# Patient Record
Sex: Female | Born: 1995 | Race: White | Hispanic: No | Marital: Single | Smoking: Never smoker
Health system: Southern US, Community
[De-identification: ages and names within clinical notes are randomized; demographics above are authoritative.]

## PROBLEM LIST (undated history)

## (undated) DIAGNOSIS — L309 Dermatitis, unspecified: Secondary | ICD-10-CM

## (undated) DIAGNOSIS — F419 Anxiety disorder, unspecified: Secondary | ICD-10-CM

## (undated) DIAGNOSIS — F429 Obsessive-compulsive disorder, unspecified: Secondary | ICD-10-CM

## (undated) DIAGNOSIS — B019 Varicella without complication: Secondary | ICD-10-CM

## (undated) DIAGNOSIS — N390 Urinary tract infection, site not specified: Secondary | ICD-10-CM

## (undated) HISTORY — DX: Urinary tract infection, site not specified: N39.0

## (undated) HISTORY — DX: Anxiety disorder, unspecified: F41.9

## (undated) HISTORY — DX: Dermatitis, unspecified: L30.9

## (undated) HISTORY — DX: Varicella without complication: B01.9

## (undated) HISTORY — DX: Obsessive-compulsive disorder, unspecified: F42.9

---
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
## 2015-02-13 ENCOUNTER — Ambulatory Visit (INDEPENDENT_AMBULATORY_CARE_PROVIDER_SITE_OTHER): Payer: PPO | Admitting: Family Medicine

## 2015-02-13 VITALS — BP 108/70 | HR 123 | Temp 102.2°F | Resp 18 | Ht 62.25 in | Wt 142.4 lb

## 2015-02-13 DIAGNOSIS — R05 Cough: Secondary | ICD-10-CM | POA: Diagnosis not present

## 2015-02-13 DIAGNOSIS — R509 Fever, unspecified: Secondary | ICD-10-CM | POA: Diagnosis not present

## 2015-02-13 DIAGNOSIS — J029 Acute pharyngitis, unspecified: Secondary | ICD-10-CM | POA: Diagnosis not present

## 2015-02-13 DIAGNOSIS — R059 Cough, unspecified: Secondary | ICD-10-CM

## 2015-02-13 MED ORDER — HYDROCODONE-HOMATROPINE 5-1.5 MG/5ML PO SYRP: 5.0000 mL | ORAL_SOLUTION | Freq: Three times a day (TID) | ORAL | Status: DC | PRN

## 2015-02-13 MED ORDER — IBUPROFEN 200 MG PO TABS
800.0000 mg | ORAL_TABLET | Freq: Once | ORAL | Status: AC
  Administered 2015-02-13: 800 mg via ORAL

## 2015-02-13 MED ORDER — AMOXICILLIN 875 MG PO TABS: 875.0000 mg | ORAL_TABLET | Freq: Two times a day (BID) | ORAL | Status: DC

## 2015-02-13 NOTE — Progress Notes (Signed)
By signing my name below, I, Stann Ore, attest that this documentation has been prepared under the direction and in the presence of Elvina Sidle, MD. Electronically Signed: Stann Ore, Scribe. 02/13/2015 , 1:45 PM .  Patient was seen in room 9 .   Patient ID: Mariah Cross MRN: [id number], DOB: 12/18/1995, 20 y.o. Date of Encounter: 02/13/2015  Primary Physician: Georgiann Hahn, MD  Chief Complaint:  Chief Complaint  Patient presents with  . URI    started on friday with a sore throat and headache. Body aches. Chills,     HPI:  Mariah Cross is a 20 y.o. female who presents to [facility] complaining of URI symptoms that started 2 days ago. She states that it started with a cough with scratchy throat 2 days ago. Then, yesterday, she noticed a migraine with sore throat, myalgia and chills. It has gradually worsened since. She notes having intermittent productive coughs. She's been taking motrin without relief.   She attends [employer]. 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   Past Medical History  Diagnosis Date  . OCD (obsessive compulsive disorder)   . Anxiety 05/24/2011  . Eczema   . Varicella 1999  . UTI (lower urinary tract infection) 05/26/2011    cystitis,E.Coli, Rx TMP SMX, first UTI     Home Meds: Prior to Admission medications   Medication Sig Start Date End Date Taking? Authorizing Provider  clonazePAM (KLONOPIN) 0.5 MG tablet One at onset of panic 06/27/14  Yes Tonye Pearson, MD  escitalopram (LEXAPRO) 20 MG tablet Take 1 tablet (20 mg total) by mouth daily. 11/09/14  Yes Todd McVeigh, PA  triamcinolone ointment (KENALOG) 0.1 % Apply 1 application topically 3 (three) times daily. 03/14/14  Yes Collie Siad English, PA    Allergies: No Known Allergies  Social History   Social History  . Marital Status: Single    Spouse Name: N/A  . Number of Children: N/A  . Years of Education: N/A    Occupational History  . Not on file.   Social History Main Topics  . Smoking status: Never Smoker   . Smokeless tobacco: Never Used  . Alcohol Use: No  . Drug Use: No  . Sexual Activity: No   Other Topics Concern  . 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   Lives with mom and 2 brothers   Parents divorced   Strong family hx of alcohol and drug addiction and anxiety/panic/OCD disorder                        Review of Systems: Constitutional: negative for fever, night sweats, weight changes, or fatigue; positive for chills HEENT: negative for vision changes, hearing loss, congestion, rhinorrhea, ST, epistaxis, or sinus pressure; positive for sore throat Cardiovascular: negative for chest pain or palpitations Respiratory: negative for hemoptysis, wheezing, shortness of breath; positive for cough Abdominal: negative for abdominal pain, nausea, vomiting, diarrhea, or constipation Dermatological: negative for rash Musc: positive for myalgia (general) Neurologic: negative for dizziness, or syncope; positive for headache All other systems reviewed and are otherwise negative with the exception to those above and in the HPI.  Physical Exam: Blood pressure 108/70, pulse 123, temperature 102.2 F (39 C), temperature source Oral, resp. rate 18, height 5' 2.25" (1.581 m), weight 142 lb 6.4 oz (64.592 kg), last menstrual period 12/14/2014, SpO2  97 %., Body mass index is 25.84 kg/(m^2). General: Well developed, well nourished, in no acute distress. Head: Normocephalic, atraumatic, eyes without discharge, sclera non-icteric, nares are without discharge. Bilateral auditory canals clear, TM's are without perforation, pearly grey and translucent with reflective cone of light bilaterally. Oral cavity moist, posterior pharynx without exudate, erythema, peritonsillar abscess, or post nasal drip.  Neck: Supple. No thyromegaly. Full ROM. No  lymphadenopathy. Lungs: Clear bilaterally to auscultation without wheezes, rales, or rhonchi. Breathing is unlabored. Heart: RRR with S1 S2. No murmurs, rubs, or gallops appreciated. Msk:  Strength and tone normal for age. Extremities/Skin: Warm and dry. No clubbing or cyanosis. No edema. No rashes or suspicious lesions. Neuro: Alert and oriented X 3. Moves all extremities spontaneously. Gait is normal. CNII-XII grossly in tact. Psych:  Responds to questions appropriately with a normal affect.   Labs:  ASSESSMENT AND PLAN:  20 y.o. year old female with  This chart was scribed in my presence and reviewed by me personally.    ICD-9-CM ICD-10-CM   1. Fever, unspecified 780.60 R50.9 ibuprofen (ADVIL,MOTRIN) tablet 800 mg     amoxicillin (AMOXIL) 875 MG tablet     HYDROcodone-homatropine (HYCODAN) 5-1.5 MG/5ML syrup     Culture, Group A Strep  2. Acute pharyngitis, unspecified etiology 462 J02.9 amoxicillin (AMOXIL) 875 MG tablet     Culture, Group A Strep  3. Cough 786.2 R05 HYDROcodone-homatropine (HYCODAN) 5-1.5 MG/5ML syrup       Signed, Elvina Sidle, MD 02/13/2015 1:45 PM

## 2015-02-13 NOTE — Patient Instructions (Signed)
Usually the fluid begins with a high fever on day 1. Your symptoms are more typical for pharyngitis with cough. The throat culture should be back by Tuesday or Wednesday.

## 2015-02-15 LAB — CULTURE, GROUP A STREP: Organism ID, Bacteria: NORMAL

## 2015-06-03 NOTE — Telephone Encounter (Signed)
close

## 2016-01-31 ENCOUNTER — Ambulatory Visit (INDEPENDENT_AMBULATORY_CARE_PROVIDER_SITE_OTHER): Payer: PPO | Admitting: Physician Assistant

## 2016-01-31 VITALS — BP 103/69 | HR 92 | Temp 98.0°F | Resp 16 | Ht 62.25 in | Wt 133.6 lb

## 2016-01-31 DIAGNOSIS — D72829 Elevated white blood cell count, unspecified: Secondary | ICD-10-CM

## 2016-01-31 DIAGNOSIS — D72828 Other elevated white blood cell count: Secondary | ICD-10-CM | POA: Diagnosis not present

## 2016-01-31 DIAGNOSIS — J029 Acute pharyngitis, unspecified: Secondary | ICD-10-CM | POA: Diagnosis not present

## 2016-01-31 DIAGNOSIS — J351 Hypertrophy of tonsils: Secondary | ICD-10-CM

## 2016-01-31 LAB — POCT CBC
Granulocyte percent: 77.2 %G (ref 37–80)
HCT, POC: 40.7 % (ref 37.7–47.9)
Hemoglobin: 14 g/dL (ref 12.2–16.2)
Lymph, poc: 1.6 (ref 0.6–3.4)
MCH, POC: 28.5 pg (ref 27–31.2)
MCHC: 34.3 g/dL (ref 31.8–35.4)
MCV: 83.1 fL (ref 80–97)
MID (cbc): 0.9 (ref 0–0.9)
MPV: 7.6 fL (ref 0–99.8)
POC Granulocyte: 8.3 — AB (ref 2–6.9)
POC LYMPH PERCENT: 14.9 %L (ref 10–50)
POC MID %: 7.9 % (ref 0–12)
Platelet Count, POC: 231 10*3/uL (ref 142–424)
RBC: 4.9 M/uL (ref 4.04–5.48)
RDW, POC: 13.6 %
WBC: 10.8 10*3/uL — AB (ref 4.6–10.2)

## 2016-01-31 MED ORDER — METHYLPREDNISOLONE ACETATE 80 MG/ML IJ SUSP
80.0000 mg | Freq: Once | INTRAMUSCULAR | Status: AC
  Administered 2016-01-31: 80 mg via INTRAMUSCULAR

## 2016-01-31 MED ORDER — CLINDAMYCIN HCL 300 MG PO CAPS: 300.0000 mg | ORAL_CAPSULE | Freq: Three times a day (TID) | ORAL | 0 refills | Status: AC

## 2016-01-31 NOTE — Progress Notes (Signed)
Mariah Cross  MRN: [id number] DOB: 1995/12/22  PCP: Georgiann Hahn, MD  Subjective:  Pt is a 21 year old female who presents to clinic for sore throat x several months. She started feeling bad in Sept.  Since that time, she has presented to [facility] multiple times, she has been tested multiple times for Strep, mono, flu  - all negative. She has been treated with prednisone, Omnicef, and supportive care. She has never felt 100% better.  Tonsils never went down, but pain went away. Throat is still swollen.  Today she c/o swollen tonsils. +body aches, chills, some fatigue, nauseous Admits to sexual contact in the past 6 months.    09/28/2015 - Rapid strep, strep culture, mono - negative -  Tx w Cefdinir  9/11 - Influenza, mono - negative - no treatment 9/26 - Yearly STD testing - CG/Chlamydia - negative - no treatment 10/26 - CG/Chlamydia, Rapid strep - negative.  - no treatment 11/09 - Oral culture, mono, rapid strep - negative - Tx w Deltasone taper and Cefdinir 11/14 - RPR, HIV - non reactive - no treatment  Review of Systems  Constitutional: Positive for chills and fatigue. Negative for diaphoresis and fever.  HENT: Positive for sore throat, trouble swallowing and voice change. Negative for congestion, postnasal drip, rhinorrhea, sinus pressure and sneezing.   Respiratory: Negative for cough, chest tightness, shortness of breath and wheezing.   Cardiovascular: Negative for chest pain and palpitations.  Gastrointestinal: Positive for nausea. Negative for abdominal pain, diarrhea and vomiting.  Neurological: Negative for weakness, light-headedness and headaches.  Psychiatric/Behavioral: Positive for sleep disturbance. Negative for agitation.    Patient Active Problem List   Diagnosis Date Noted  . Scoliosis?, mild 07/15/2011  . Anxiety 05/24/2011  . OCD (obsessive compulsive disorder) 07/16/2010    Current Outpatient Prescriptions on File Prior to Visit   Medication Sig Dispense Refill  . clonazePAM (KLONOPIN) 0.5 MG tablet One at onset of panic 30 tablet 1  . escitalopram (LEXAPRO) 20 MG tablet Take 1 tablet (20 mg total) by mouth daily. 90 tablet 0   No current facility-administered medications on file prior to visit.     No Known Allergies   Objective:  BP 103/69 (BP Location: Right Arm, Patient Position: Sitting, Cuff Size: Small)   Pulse 92   Temp 98 F (36.7 C) (Oral)   Resp 16   Ht 5' 2.25" (1.581 m)   Wt 133 lb 9.6 oz (60.6 kg)   LMP 12/30/2015   SpO2 96%   BMI 24.24 kg/m   Physical Exam  Constitutional: She is oriented to person, place, and time and well-developed, well-nourished, and in no distress. No distress.  HENT:  Right Ear: Tympanic membrane normal.  Left Ear: Tympanic membrane normal.  Mouth/Throat: Mucous membranes are normal. Posterior oropharyngeal edema present. No oropharyngeal exudate, posterior oropharyngeal erythema or tonsillar abscesses.    Cardiovascular: Normal rate, regular rhythm and normal heart sounds.   Pulmonary/Chest: Effort normal and breath sounds normal. No respiratory distress.  Lymphadenopathy:       Head (right side): Posterior auricular adenopathy present.       Head (left side): Posterior auricular adenopathy present.  Neurological: She is alert and oriented to person, place, and time. GCS score is 15.  Skin: Skin is warm and dry.  Psychiatric: Mood, memory, affect and judgment normal.  Vitals reviewed.   Results for orders placed or performed in visit on 01/31/16  POCT CBC  Result  Value Ref Range   WBC 10.8 (A) 4.6 - 10.2 K/uL   Lymph, poc 1.6 0.6 - 3.4   POC LYMPH PERCENT 14.9 10 - 50 %L   MID (cbc) 0.9 0 - 0.9   POC MID % 7.9 0 - 12 %M   POC Granulocyte 8.3 (A) 2 - 6.9   Granulocyte percent 77.2 37 - 80 %G   RBC 4.90 4.04 - 5.48 M/uL   Hemoglobin 14.0 12.2 - 16.2 g/dL   HCT, POC 16.140.7 09.637.7 - 47.9 %   MCV 83.1 80 - 97 fL   MCH, POC 28.5 27 - 31.2 pg   MCHC 34.3  31.8 - 35.4 g/dL   RDW, POC 04.513.6 %   Platelet Count, POC 231 142 - 424 K/uL   MPV 7.6 0 - 99.8 fL    Assessment and Plan :  1. Sore throat 2. Swollen tonsil 3. Acute pharyngitis, unspecified etiology 4. Granulocytosis 5. Leukocytosis, unspecified type - POCT CBC - GC NAA, Pharyngeal - Ambulatory referral to ENT - methylPREDNISolone acetate (DEPO-MEDROL) injection 80 mg; Inject 1 mL (80 mg total) into the muscle once. - clindamycin (CLEOCIN) 300 MG capsule; Take 1 capsule (300 mg total) by mouth 3 (three) times daily.  Dispense: 30 capsule; Refill: 0 - CG NAA labs pending. Will contact with results. Referral to ENT as she has presented [facility] her [facility] multiple times in the past few months with no relief. Supportive care: Salt water gargles, warm tea with honey. RTC if no improvement/worsening symptoms.   Mariah CollieWhitney Louisiana Searles, PA-C  [facility] 01/31/2016 3:08 PM

## 2016-01-31 NOTE — Patient Instructions (Addendum)
Please take the entire course of your antibiotics. If you start feeling better and stop taking it, the infection could come back - and come back resistant to the medication you were taking. This is important for all your antibiotic regimens.  Please stay well hydrated, drink at least 2 liters of water a day.  For sore throat - Use warm salt water gargles. Gargle liquid benadryl. Warm tea with honey  ENT will call you to schedule an appt.   Thank you for coming in today. I hope you feel we met your needs.  Feel free to call [facility] if you have any questions or further requests.  Please consider signing up for MyChart if you do not already have it, as this is a great way to communicate with me.  Best,  Whitney McVey, PA-C  IF you received an x-ray today, you will receive an invoice from [facility]. Please contact [facility] at [phone] with questions or concerns regarding your invoice.   IF you received labwork today, you will receive an invoice from [facility]. Please contact [facility] at [phone] with questions or concerns regarding your invoice.   Our billing staff will not be able to assist you with questions regarding bills from these companies.  You will be contacted with the lab results as soon as they are available. The fastest way to get your results is to activate your My Chart account. Instructions are located on the last page of this paperwork. If you have not heard from [place] regarding the results in 2 weeks, please contact this office.

## 2016-02-01 LAB — GC NAA, PHARYNGEAL

## 2016-02-08 LAB — GC CULTURE ONLY

## 2016-02-08 LAB — SPECIMEN STATUS REPORT

## 2016-08-20 ENCOUNTER — Telehealth: Payer: Self-pay | Admitting: Physician Assistant

## 2016-08-20 NOTE — Telephone Encounter (Signed)
Pt dropped off form to be completed by Dala DockMcVey,    Best phone for pt is [phone]

## 2016-08-22 NOTE — Telephone Encounter (Signed)
Whitney, has this form been completed? Please advise, thank you/ S.Jasminne Mealy,CMA

## 2016-08-23 NOTE — Telephone Encounter (Signed)
I have not seen a form in my box. Do we know what form it is? TY

## 2016-08-23 NOTE — Telephone Encounter (Signed)
Pt will be bringing by new form again.

## 2016-08-28 ENCOUNTER — Telehealth: Payer: Self-pay | Admitting: *Deleted

## 2016-08-28 NOTE — Telephone Encounter (Signed)
Called patient advised that immunization record is ready for pickup. She stated that she will pick up today.

## 2016-08-30 ENCOUNTER — Telehealth: Payer: Self-pay | Admitting: *Deleted

## 2016-08-30 NOTE — Telephone Encounter (Signed)
Spoke with the patient's mother regarding immunizations, form was filled out based [facility]. She stated that after our conversation [facility] and [facility] needed.

## 2016-11-29 NOTE — Telephone Encounter (Signed)
error 

## 2018-11-22 ENCOUNTER — Encounter (HOSPITAL_COMMUNITY): Payer: Self-pay

## 2018-11-22 ENCOUNTER — Ambulatory Visit (HOSPITAL_COMMUNITY)
Admission: EM | Admit: 2018-11-22 | Discharge: 2018-11-22 | Disposition: A | Discharge status: Home / Self Care | Payer: 59 | Attending: Internal Medicine | Admitting: Internal Medicine

## 2018-11-22 DIAGNOSIS — S0501XA Injury of conjunctiva and corneal abrasion without foreign body, right eye, initial encounter: Secondary | ICD-10-CM

## 2018-11-22 MED ORDER — TETRACAINE HCL 0.5 % OP SOLN
OPHTHALMIC | Status: AC
  Filled 2018-11-22: qty 4

## 2018-11-22 MED ORDER — FLUORESCEIN SODIUM 1 MG OP STRP
ORAL_STRIP | OPHTHALMIC | Status: AC
  Filled 2018-11-22: qty 1

## 2018-11-22 MED ORDER — CIPROFLOXACIN HCL 0.3 % OP SOLN: 2.0000 [drp] | OPHTHALMIC | 0 refills | Status: DC

## 2018-11-22 NOTE — ED Provider Notes (Signed)
[facility]    CSN: [id number] Arrival date & time: 11/22/18  1732      History   Chief Complaint Chief Complaint  Patient presents with  . Eye Drainage    HPI Mariah Cross is a 23 y.o. female history of comes to urgent care with complaints of right eye pain which started an hour ago.  Patient took out her contact lens and soon after that she started experiencing right eye pain.  Pain is burning in nature, constant.  Patient has not tried any over-the-counter medications.  She flushed her eyes out with water with no improvement in the pain.  She denies any blurry vision.  Pain is aggravated by looking at bright light.  It is associated with tearing of the eye.  No radiation of pain.Marland Kitchen   HPI  Past Medical History:  Diagnosis Date  . Anxiety 05/24/2011  . Eczema   . OCD (obsessive compulsive disorder)   . UTI (lower urinary tract infection) 05/26/2011   cystitis,E.Coli, Rx TMP SMX, first UTI  . Varicella 1999    Patient Active Problem List   Diagnosis Date Noted  . Scoliosis?, mild 07/15/2011  . Anxiety 05/24/2011  . OCD (obsessive compulsive disorder) 07/16/2010    Past Surgical History:  Procedure Laterality Date  . ABSCESS DRAINAGE  12/2006   thigh, oxacillin sensitive stahp aureus  . FACIAL LACERATIONS REPAIR  05/25/2011   wound adhesive applied    OB History   No obstetric history on file.      Home Medications    Prior to Admission medications   Medication Sig Start Date End Date Taking? Authorizing Provider  clindamycin (CLEOCIN) 300 MG capsule Take 1 capsule (300 mg total) by mouth 3 (three) times daily. 01/31/16   McVey, Madelaine Bhat, PA-C  clonazePAM (KLONOPIN) 0.5 MG tablet One at onset of panic 06/27/14   Tonye Pearson, MD  escitalopram (LEXAPRO) 20 MG tablet Take 1 tablet (20 mg total) by mouth daily. 11/09/14   Raelyn Ensign, PA    Family History Family History  Problem Relation Age of Onset  . Cancer Maternal Grandmother 100     breast  . Diabetes Maternal Grandmother   . Anxiety disorder Mother   . Diabetes Mother        gestational DM  . Hyperlipidemia Mother   . Depression Father   . Alcohol abuse Maternal Aunt   . Drug abuse Maternal Aunt   . Alcohol abuse Maternal Uncle   . Drug abuse Maternal Uncle   . Alcohol abuse Maternal Grandfather     Social History Social History   Tobacco Use  . Smoking status: Never Smoker  . Smokeless tobacco: Never Used  Substance Use Topics  . Alcohol use: No    Alcohol/week: 0.0 standard drinks  . Drug use: No     Allergies   Patient has no known allergies.   Review of Systems Review of Systems  Constitutional: Negative for activity change.  HENT: Negative.   Eyes: Positive for photophobia, pain, discharge and redness. Negative for itching and visual disturbance.  Respiratory: Negative.   Cardiovascular: Negative.   Gastrointestinal: Negative.   Genitourinary: Negative.   Musculoskeletal: Negative.   Neurological: Negative.      Physical Exam Triage Vital Signs ED Triage Vitals [11/22/18 1741]  Enc Vitals Group     BP      Pulse      Resp      Temp  Temp src      SpO2      Weight      Height      Head Circumference      Peak Flow      Pain Score 8     Pain Loc      Pain Edu?      Excl. in [place]?    No data found.  Updated Vital Signs LMP 10/23/2018   Visual Acuity Right Eye Distance:   Left Eye Distance:   Bilateral Distance:    Right Eye Near:   Left Eye Near:    Bilateral Near:     Physical Exam Constitutional:      General: She is in acute distress.     Appearance: She is not ill-appearing or toxic-appearing.  HENT:     Head: Normocephalic and atraumatic.     Right Ear: Tympanic membrane normal.     Left Ear: Tympanic membrane normal.     Nose: Nose normal. No congestion or rhinorrhea.  Eyes:     Extraocular Movements: Extraocular movements intact.     Pupils: Pupils are equal, round, and reactive to light.      Comments: Right conjunctival erythema.  No corneal edema noted.  Abdominal:     General: Bowel sounds are normal.     Palpations: Abdomen is soft.  Skin:    Capillary Refill: Capillary refill takes less than 2 seconds.  Neurological:     General: No focal deficit present.     Mental Status: She is alert and oriented to person, place, and time.      UC Treatments / Results  Labs (all labs ordered are listed, but only abnormal results are displayed) Labs Reviewed - No data to display  EKG   Radiology No results found.  Procedures Procedures (including critical care time)  Medications Ordered in UC Medications - No data to display  Initial Impression / Assessment and Plan / UC Course  I have reviewed the triage vital signs and the nursing notes.  Pertinent labs & imaging results that were available during my care of the patient were reviewed by me and considered in my medical decision making (see chart for details).     1.  Right conjunctival abrasion: Corneal was without abrasion on fluorescein staining Ciprofloxacin ophthalmic solution every 4 hours for 3 days If patient experiences worsening pain, visual changes or blurred vision she is advised to go to the ED to be evaluated. Final Clinical Impressions(s) / UC Diagnoses   Final diagnoses:  None   Discharge Instructions   None    ED Prescriptions    None     PDMP not reviewed this encounter.   Chase Picket, MD 11/22/18 1816

## 2018-11-22 NOTE — ED Triage Notes (Signed)
Pt present eye drainage from her right eye, she scratch her eye and now it burning and very watery.

## 2018-11-23 ENCOUNTER — Encounter (HOSPITAL_BASED_OUTPATIENT_CLINIC_OR_DEPARTMENT_OTHER): Payer: Self-pay | Admitting: Emergency Medicine

## 2018-11-23 ENCOUNTER — Other Ambulatory Visit: Payer: Self-pay

## 2018-11-23 ENCOUNTER — Emergency Department (HOSPITAL_BASED_OUTPATIENT_CLINIC_OR_DEPARTMENT_OTHER)
Admission: EM | Admit: 2018-11-23 | Discharge: 2018-11-23 | Disposition: A | Discharge status: Home / Self Care | Payer: 59 | Attending: Emergency Medicine | Admitting: Emergency Medicine

## 2018-11-23 DIAGNOSIS — H16001 Unspecified corneal ulcer, right eye: Secondary | ICD-10-CM | POA: Diagnosis not present

## 2018-11-23 DIAGNOSIS — H5711 Ocular pain, right eye: Secondary | ICD-10-CM | POA: Diagnosis present

## 2018-11-23 MED ORDER — HYDROCODONE-ACETAMINOPHEN 5-325 MG PO TABS
1.0000 | ORAL_TABLET | Freq: Once | ORAL | Status: AC
  Administered 2018-11-23: 1 via ORAL
  Filled 2018-11-23: qty 1

## 2018-11-23 MED ORDER — HYDROCODONE-ACETAMINOPHEN 5-325 MG PO TABS: 1.0000 | ORAL_TABLET | ORAL | 0 refills | Status: AC | PRN

## 2018-11-23 MED ORDER — TETRACAINE HCL 0.5 % OP SOLN
2.0000 [drp] | Freq: Once | OPHTHALMIC | Status: AC
  Administered 2018-11-23: 16:00:00 2 [drp] via OPHTHALMIC
  Filled 2018-11-23: qty 4

## 2018-11-23 MED ORDER — KETOROLAC TROMETHAMINE 0.4 % OP SOLN: 1.0000 [drp] | Freq: Four times a day (QID) | OPHTHALMIC | 0 refills | Status: AC | PRN

## 2018-11-23 MED ORDER — FLUORESCEIN SODIUM 1 MG OP STRP
1.0000 | ORAL_STRIP | Freq: Once | OPHTHALMIC | Status: AC
  Administered 2018-11-23: 1 via OPHTHALMIC
  Filled 2018-11-23: qty 1

## 2018-11-23 MED ORDER — IBUPROFEN 400 MG PO TABS
600.0000 mg | ORAL_TABLET | Freq: Once | ORAL | Status: AC
  Administered 2018-11-23: 600 mg via ORAL
  Filled 2018-11-23: qty 1

## 2018-11-23 MED ORDER — MOXIFLOXACIN HCL 0.5 % OP SOLN: OPHTHALMIC | 0 refills | Status: AC

## 2018-11-23 NOTE — ED Provider Notes (Signed)
[facility] EMERGENCY DEPARTMENT Provider Note   CSN: [id number] Arrival date & time: 11/23/18  1305     History   Chief Complaint Chief Complaint  Patient presents with  . Eye Pain    HPI Mariah Cross is a 23 y.o. female presenting to the emergency department for reevaluation of right eye pain that began yesterday.  Patient states yesterday she began having discomfort in her right eye after today put her contact in, that has been gradually worsening since then.  She is evaluated yesterday evening at urgent care and diagnosed with an abrasion over her sclera.  She was prescribed ophthalmic ciprofloxacin which she has been using as prescribed.  She states yesterday evening her symptoms worsened she has been unable to open her right eye secondary to the pain and discomfort.  She states the light is not bothering her as much as the pain with movement of her eye.  She is having no pain or irritation of her left eye.  She a mild right-sided headache and blurry vision.  She has been having tearing from her eyes though no purulent drainage.     The history is provided by the patient and medical records.    Past Medical History:  Diagnosis Date  . Anxiety 05/24/2011  . Eczema   . OCD (obsessive compulsive disorder)   . UTI (lower urinary tract infection) 05/26/2011   cystitis,E.Coli, Rx TMP SMX, first UTI  . Varicella 1999    Patient Active Problem List   Diagnosis Date Noted  . Scoliosis?, mild 07/15/2011  . Anxiety 05/24/2011  . OCD (obsessive compulsive disorder) 07/16/2010    Past Surgical History:  Procedure Laterality Date  . ABSCESS DRAINAGE  12/2006   thigh, oxacillin sensitive stahp aureus  . FACIAL LACERATIONS REPAIR  05/25/2011   wound adhesive applied     OB History   No obstetric history on file.      Home Medications    Prior to Admission medications   Medication Sig Start Date End Date Taking? Authorizing Provider  ciprofloxacin (CILOXAN) 0.3 %  ophthalmic solution Place 2 drops into the right eye every 4 (four) hours while awake for 5 days. Administer 1 drop, every 2 hours, while awake, for 2 days. Then 1 drop, every 4 hours, while awake, for the next 5 days. 11/22/18 11/27/18  LampteyBritta Mccreedy, MD  clindamycin (CLEOCIN) 300 MG capsule Take 1 capsule (300 mg total) by mouth 3 (three) times daily. 01/31/16   McVey, Madelaine Bhat, PA-C  clonazePAM (KLONOPIN) 0.5 MG tablet One at onset of panic 06/27/14   Tonye Pearson, MD  escitalopram (LEXAPRO) 20 MG tablet Take 1 tablet (20 mg total) by mouth daily. 11/09/14   Raelyn Ensign, PA    Family History Family History  Problem Relation Age of Onset  . Cancer Maternal Grandmother 12       breast  . Diabetes Maternal Grandmother   . Anxiety disorder Mother   . Diabetes Mother        gestational DM  . Hyperlipidemia Mother   . Depression Father   . Alcohol abuse Maternal Aunt   . Drug abuse Maternal Aunt   . Alcohol abuse Maternal Uncle   . Drug abuse Maternal Uncle   . Alcohol abuse Maternal Grandfather     Social History Social History   Tobacco Use  . Smoking status: Never Smoker  . Smokeless tobacco: Never Used  Substance Use Topics  . Alcohol use: No  Alcohol/week: 0.0 standard drinks  . Drug use: No     Allergies   Patient has no known allergies.   Review of Systems Review of Systems  Eyes: Positive for pain, redness and visual disturbance.  All other systems reviewed and are negative.    Physical Exam Updated Vital Signs BP 112/73 (BP Location: Right Arm)   Pulse 77   Temp 99.2 F (37.3 C) (Oral)   Resp 18   Ht 5\' 1"  (1.549 m)   Wt 63 kg   LMP 09/23/2018 (Approximate)   SpO2 100%   BMI 26.26 kg/m   Physical Exam Vitals signs and nursing note reviewed.  Constitutional:      Appearance: She is well-developed.     Comments: Patient appears uncomfortable, holding both of her eyes closed and preferring a dark room.  HENT:     Head:  Normocephalic and atraumatic.  Eyes:     General: Lids are normal.     Extraocular Movements: Extraocular movements intact.     Conjunctiva/sclera: Conjunctivae normal.      Comments: Significant improvement in symptoms after application of tetracaine drops.  Patient is able to open her eye without difficulty and tolerating the light.  PERRL.  No consensual photophobia.  Mild conjunctival injection.  Right eye visualized under Woods lamp with fluorescein stain.  There is a circular uniform uptake of fluorescein stain over the cornea that only spares about 2 to 3 mm of the outermost aspect of the iris.  No other uptake noted.  No Seidel sign.  Cardiovascular:     Rate and Rhythm: Normal rate.  Pulmonary:     Effort: Pulmonary effort is normal.  Neurological:     Mental Status: She is alert.  Psychiatric:        Mood and Affect: Mood normal.        Behavior: Behavior normal.      ED Treatments / Results  Labs (all labs ordered are listed, but only abnormal results are displayed) Labs Reviewed - No data to display  EKG None  Radiology No results found.  Procedures Procedures (including critical care time)  Medications Ordered in ED Medications  fluorescein ophthalmic strip 1 strip (1 strip Right Eye Given 11/23/18 1538)  tetracaine (PONTOCAINE) 0.5 % ophthalmic solution 2 drop (2 drops Right Eye Given 11/23/18 1538)  ibuprofen (ADVIL) tablet 600 mg (600 mg Oral Given 11/23/18 1718)  HYDROcodone-acetaminophen (NORCO/VICODIN) 5-325 MG per tablet 1 tablet (1 tablet Oral Given 11/23/18 1728)     Initial Impression / Assessment and Plan / ED Course  I have reviewed the triage vital signs and the nursing notes.  Pertinent labs & imaging results that were available during my care of the patient were reviewed by me and considered in my medical decision making (see chart for details).   Patient is a contact lens wearer, presenting with worsening pain to the right eye that began  yesterday.  Seen at urgent care yesterday and prescribed ciprofloxacin drops for suspected conjunctival abrasion over the sclera.  Pain is worsened since that time.  On exam, symptoms nearly resolved with tetracaine drops.  Right conjunctival with mild injection, PERRL, no consensual photophobia.  There is a large circular uptake over the cornea but only spares about 2 mm around the border of the iris.  No Seidel sign.  Exam consistent with corneal ulcer.  Given patient's significant pain, size of ulcer and lack of follow-up, will consult ophthalmology.   Clinical Course as of Nov 22 1816  Sun Nov 23, 2018  1754 Significant delay >2 hours in receiving call from ophthalmology, as it appears the ophthalmologist on call is unknown. [facility] working diligently on this.  Pt aware of this, redosed with tetracaine and PO pain medications.    [JR]    Clinical Course User Index [JR] Sarath Privott, [place] N, PA-C    Due to significant delay and ophthalmology consult care handed off at shift change to PA Joy.  Plan to consult ophthalmology for close follow-up and recommendations of pain control.  Consider topical NSAID.  Consider cycloplegic, however patient does not seem to have real photophobia as symptoms significantly improved with topical tetracaine and was tolerating the light without difficulty.       Final Clinical Impressions(s) / ED Diagnoses   Final diagnoses:  Ulcer of right cornea    ED Discharge Orders    None       Bettyann Birchler, [place] N, PA-C 11/23/18 1818    Derwood KaplanNanavati, Ankit, MD 12/02/18 [phone]

## 2018-11-23 NOTE — ED Notes (Signed)
Pt states she cannot open her eyes and cannot do a visual acuity

## 2018-11-23 NOTE — ED Notes (Signed)
Pt reports using abx ointment last PM and symptoms have worsened. Pain started in R eye but now is in both eyes. Pt also reports R face pain but no swelling or redness noted. Pt unable to open eyes to assess. Reports watering to bilat eyes. Mother has been medicating pt with tylenol and ibuprofen.

## 2018-11-23 NOTE — ED Notes (Signed)
ED Provider at bedside. 

## 2018-11-23 NOTE — ED Triage Notes (Signed)
Pt reports injury to right eye, and was treated by UC yesterday and given abx. Pt reports worsening pain. Pt also c/o right side facial pain

## 2018-11-23 NOTE — ED Provider Notes (Signed)
Mariah Cross is a 23 y.o. female, presenting to the ED with right eye pain and blurriness to vision.   HPI from Mariah Robinson, PA-C: "Mariah Cross is a 23 y.o. female presenting to the emergency department for reevaluation of right eye pain that began yesterday.  Patient states yesterday she began having discomfort in her right eye after today put her contact in, that has been gradually worsening since then.  She is evaluated yesterday evening at urgent care and diagnosed with an abrasion over her sclera.  She was prescribed ophthalmic ciprofloxacin which she has been using as prescribed.  She states yesterday evening her symptoms worsened she has been unable to open her right eye secondary to the pain and discomfort.  She states the light is not bothering her as much as the pain with movement of her eye.  She is having no pain or irritation of her left eye.  She a mild right-sided headache and blurry vision.  She has been having tearing from her eyes though no purulent drainage."  Past Medical History:  Diagnosis Date  . Anxiety 05/24/2011  . Eczema   . OCD (obsessive compulsive disorder)   . UTI (lower urinary tract infection) 05/26/2011   cystitis,E.Coli, Rx TMP SMX, first UTI  . Varicella 1999     Physical Exam  BP 112/73 (BP Location: Right Arm)   Pulse 77   Temp 99.2 F (37.3 C) (Oral)   Resp 18   Ht 5\' 1"  (1.549 m)   Wt 63 kg   LMP 09/23/2018 (Approximate)   SpO2 100%   BMI 26.26 kg/m   Physical Exam Vitals signs and nursing note reviewed.  Constitutional:      General: She is not in acute distress.    Appearance: She is well-developed. She is not diaphoretic.  HENT:     Head: Normocephalic and atraumatic.  Eyes:     Conjunctiva/sclera: Conjunctivae normal.     Comments:    Visual Acuity  Right Eye Distance: 20/100(without glasses) Left Eye Distance: 20/40(without glasses) Bilateral Distance: 20/50(without glasses)  Right Eye Near:   Left Eye Near:    Bilateral  Near:    No obvious abnormality to the patient's eye upon gross exam.  Pain with EOMs.  Pain with opening of the eyelids.  Neck:     Musculoskeletal: Neck supple.  Cardiovascular:     Rate and Rhythm: Normal rate and regular rhythm.  Pulmonary:     Effort: Pulmonary effort is normal.  Skin:    General: Skin is warm and dry.     Coloration: Skin is not pale.  Neurological:     Mental Status: She is alert.  Psychiatric:        Behavior: Behavior normal.     ED Course/Procedures   Clinical Course as of Nov 23 1930  04-24-1986 Nov 23, 2018  1754 Significant delay >2 hours in receiving call from ophthalmology, as it appears the ophthalmologist on call is unknown. [facility] working diligently on this.  Pt aware of this, redosed with tetracaine and PO pain medications.    [JR]  1906 Spoke with Dr. Nov 25, 2018 due to patient's previous, but distant, relationship with the practice.   I acknowledged to Dr. Fabian Sharp that the patient's connection with his practice was indeed distant, but he graciously agreed to help me at least triage the patient.  I sincerely appreciate his assistance. I reevaluated the patient's eye to assure she did not have any visible signs of  severe, central ulcer that could be noted without the use of fluorescein.  She indeed did not have any visible abnormalities during this exam. In this case, he states he will agree to see the patient in the office tomorrow morning at 8 AM. Discontinue the ciprofloxacin drops and begin using Vigamox 1 drop every hour for 3-4 hours, then every 2 hours overnight. We can try the ketorolac drops, however, many times most things fail to control the pain from corneal ulcers and the patient should be informed of this.   [SJ]    Clinical Course User Index [JR] Cross, Mariah N, PA-C [SJ] Mariah Cross    Procedures  MDM   Patient care handoff report received from Mariah Robinson, PA-C. Plan: Make contact with ophthalmology to ensure  close follow-up.  Patient presents with right eye pain beginning yesterday.  PA Quentin Cornwall performed full eye exam and found evidence of right central ulcer.  I did not repeat the fluorescein exam, however, I was able to note that there were no noted obvious abnormalities to the eye upon gross inspection. Patient did say she had some relief with the Vicodin so she was provided with a prescription for this medication. We were able to secure close follow-up with ophthalmology for her tomorrow morning. The patient was given instructions for home care as well as strict return precautions. Patient voices understanding of these instructions, accepts the plan, and is comfortable with discharge.   We had an issue during the care of this patient and that the ophthalmologist who is listed to be on call in [place] insists he is not actually on call, but was unable to tell us who was supposed to be on call. We tried to make contact (via our Network engineer and through [employer]) with the other physicians in the practice, however, we were told no one in that practice is on call. Before I took over care of the patient, attempts were made to also make contact with ophthalmology leadership. After speaking with the patient in more detail, we were able to find out that she has previously been seen by Mariah Cross, though it has been several years.     Lorayne Bender, PA-C 11/24/18 Cora, [place], MD 11/24/18 [phone]

## 2018-11-23 NOTE — Discharge Instructions (Addendum)
Follow-up with the ophthalmologist by going to the office tomorrow morning at 8 AM.  Do not use contact lenses until told otherwise by the ophthalmologist.  Moxifloxacin eyedrops: Apply 1 drop in the right eye every hour for the first 4 hours, then every 2 hours until told otherwise by the ophthalmologist.  Discontinue using the ciprofloxacin drops. Ketorolac drops: These drops may be used 1 drop in the right eye every 4 hours.  Use these for no longer than 4 days in a row. Antiinflammatory medications: Take 600 mg of ibuprofen every 6 hours or 440 mg (over the counter dose) to 500 mg (prescription dose) of naproxen every 12 hours for the next 3 days. After this time, these medications may be used as needed for pain. Take these medications with food to avoid upset stomach. Choose only one of these medications, do not take them together. Acetaminophen (generic for Tylenol): Should you continue to have additional pain while taking the ibuprofen or naproxen, you may add in acetaminophen as needed. Your daily total maximum amount of acetaminophen from all sources should be limited to 4000mg /day for persons without liver problems, or 2000mg /day for those with liver problems. Vicodin: May take Vicodin (hydrocodone-acetaminophen) as needed for severe pain.   Do not drive or perform other dangerous activities while taking this medication as it can cause drowsiness as well as changes in reaction time and judgement.   Please note that each pill of Vicodin contains 325 mg of acetaminophen (generic for Tylenol) and the above dosage limits apply.  Should you have worsening pain, worsening vision, changes in appearance of the eye, or any other major concerns, please proceed to the emergency department at [facility].
